# Patient Record
Sex: Male | Born: 1950 | Race: White | Hispanic: No | Marital: Single | Smoking: Current every day smoker
Health system: Southern US, Community
[De-identification: ages and names within clinical notes are randomized; demographics above are authoritative.]

## PROBLEM LIST (undated history)

## (undated) DIAGNOSIS — I10 Essential (primary) hypertension: Secondary | ICD-10-CM

## (undated) DIAGNOSIS — E119 Type 2 diabetes mellitus without complications: Secondary | ICD-10-CM

---

## 2014-07-24 ENCOUNTER — Emergency Department (HOSPITAL_COMMUNITY)
Admission: EM | Admit: 2014-07-24 | Discharge: 2014-07-24 | Disposition: A | Discharge status: Home / Self Care | Payer: No Typology Code available for payment source | Attending: Emergency Medicine | Admitting: Emergency Medicine

## 2014-07-24 ENCOUNTER — Encounter (HOSPITAL_COMMUNITY): Payer: Self-pay | Admitting: Emergency Medicine

## 2014-07-24 ENCOUNTER — Emergency Department (HOSPITAL_COMMUNITY): Payer: No Typology Code available for payment source

## 2014-07-24 DIAGNOSIS — I10 Essential (primary) hypertension: Secondary | ICD-10-CM | POA: Diagnosis not present

## 2014-07-24 DIAGNOSIS — R1012 Left upper quadrant pain: Secondary | ICD-10-CM | POA: Diagnosis not present

## 2014-07-24 DIAGNOSIS — E119 Type 2 diabetes mellitus without complications: Secondary | ICD-10-CM | POA: Diagnosis not present

## 2014-07-24 DIAGNOSIS — R1013 Epigastric pain: Secondary | ICD-10-CM | POA: Diagnosis not present

## 2014-07-24 DIAGNOSIS — R1011 Right upper quadrant pain: Secondary | ICD-10-CM | POA: Diagnosis not present

## 2014-07-24 DIAGNOSIS — M549 Dorsalgia, unspecified: Secondary | ICD-10-CM | POA: Insufficient documentation

## 2014-07-24 DIAGNOSIS — F17200 Nicotine dependence, unspecified, uncomplicated: Secondary | ICD-10-CM | POA: Diagnosis not present

## 2014-07-24 DIAGNOSIS — Z79899 Other long term (current) drug therapy: Secondary | ICD-10-CM | POA: Diagnosis not present

## 2014-07-24 HISTORY — DX: Type 2 diabetes mellitus without complications: E11.9

## 2014-07-24 HISTORY — DX: Essential (primary) hypertension: I10

## 2014-07-24 LAB — CBC WITH DIFFERENTIAL/PLATELET
BASOS ABS: 0 10*3/uL (ref 0.0–0.1)
Basophils Relative: 0 % (ref 0–1)
EOS PCT: 1 % (ref 0–5)
Eosinophils Absolute: 0.2 10*3/uL (ref 0.0–0.7)
HEMATOCRIT: 41.9 % (ref 39.0–52.0)
Hemoglobin: 14.2 g/dL (ref 13.0–17.0)
LYMPHS PCT: 20 % (ref 12–46)
Lymphs Abs: 2.2 10*3/uL (ref 0.7–4.0)
MCH: 30.1 pg (ref 26.0–34.0)
MCHC: 33.9 g/dL (ref 30.0–36.0)
MCV: 88.8 fL (ref 78.0–100.0)
MONO ABS: 0.9 10*3/uL (ref 0.1–1.0)
MONOS PCT: 9 % (ref 3–12)
Neutro Abs: 7.5 10*3/uL (ref 1.7–7.7)
Neutrophils Relative %: 70 % (ref 43–77)
Platelets: 308 10*3/uL (ref 150–400)
RBC: 4.72 MIL/uL (ref 4.22–5.81)
RDW: 12.7 % (ref 11.5–15.5)
WBC: 10.7 10*3/uL — AB (ref 4.0–10.5)

## 2014-07-24 LAB — COMPREHENSIVE METABOLIC PANEL
ALT: 12 U/L (ref 0–53)
ANION GAP: 12 (ref 5–15)
AST: 14 U/L (ref 0–37)
Albumin: 3.5 g/dL (ref 3.5–5.2)
Alkaline Phosphatase: 80 U/L (ref 39–117)
BUN: 9 mg/dL (ref 6–23)
CALCIUM: 9.3 mg/dL (ref 8.4–10.5)
CO2: 26 meq/L (ref 19–32)
CREATININE: 0.66 mg/dL (ref 0.50–1.35)
Chloride: 103 mEq/L (ref 96–112)
GFR calc Af Amer: >90 mL/min (ref >90)
GFR calc non Af Amer: >90 mL/min (ref >90)
Glucose, Bld: 117 mg/dL — ABNORMAL HIGH (ref 70–99)
Potassium: 3.5 mEq/L — ABNORMAL LOW (ref 3.7–5.3)
Sodium: 141 mEq/L (ref 137–147)
Total Bilirubin: 0.5 mg/dL (ref 0.3–1.2)
Total Protein: 7.4 g/dL (ref 6.0–8.3)

## 2014-07-24 LAB — LIPASE, BLOOD: Lipase: 17 U/L (ref 11–59)

## 2014-07-24 LAB — TROPONIN I

## 2014-07-24 MED ORDER — FAMOTIDINE IN NACL 20-0.9 MG/50ML-% IV SOLN
20.0000 mg | Freq: Once | INTRAVENOUS | Status: AC
  Administered 2014-07-24: 20 mg via INTRAVENOUS
  Filled 2014-07-24: qty 50

## 2014-07-24 MED ORDER — SUCRALFATE 1 GM/10ML PO SUSP: 1.0000 g | Freq: Three times a day (TID) | ORAL | Status: AC

## 2014-07-24 MED ORDER — FAMOTIDINE 20 MG PO TABS: 20.0000 mg | ORAL_TABLET | Freq: Two times a day (BID) | ORAL | Status: AC

## 2014-07-24 MED ORDER — ONDANSETRON HCL 4 MG/2ML IJ SOLN
4.0000 mg | Freq: Once | INTRAMUSCULAR | Status: AC
  Administered 2014-07-24: 4 mg via INTRAVENOUS
  Filled 2014-07-24: qty 2

## 2014-07-24 MED ORDER — SODIUM CHLORIDE 0.9 % IV SOLN
1000.0000 mL | Freq: Once | INTRAVENOUS | Status: AC
  Administered 2014-07-24: 1000 mL via INTRAVENOUS

## 2014-07-24 MED ORDER — HYDROMORPHONE HCL 1 MG/ML IJ SOLN
1.0000 mg | Freq: Once | INTRAMUSCULAR | Status: AC
  Administered 2014-07-24: 1 mg via INTRAVENOUS
  Filled 2014-07-24: qty 1

## 2014-07-24 NOTE — ED Notes (Signed)
Pt stuck three times for IV access. Pt refuses at this time for him to be stuck anymore at this time.  [facility] EDP aware, pt will allow phlebotomy for blood draw.

## 2014-07-24 NOTE — ED Notes (Addendum)
Pt states that he has been having 4 months of gen abd pain.  States he takes 30 ibuprofen tablets per day.  Pt states that his pain radiates to the back.  States he cannot take a deep breath d/t pain.  Has been having intermittent diarrhea.  Nauseated without vomiting.  Productive cough x 5 days.

## 2014-07-24 NOTE — Discharge Instructions (Signed)
Do not take ibuprofen, Aleve, Advil (NSAIDs). Schedule follow up with your doctor when you get home to be referred to a gastroenterologist. If you have any vomiting blood, rectal bleeding, go to the nearest ER.  Abdominal Pain Many things can cause abdominal pain. Usually, abdominal pain is not caused by a disease and will improve without treatment. It can often be observed and treated at home. Your health care provider will do a physical exam and possibly order blood tests and X-rays to help determine the seriousness of your pain. However, in many cases, more time must pass before a clear cause of the pain can be found. Before that point, your health care provider may not know if you need more testing or further treatment. HOME CARE INSTRUCTIONS  Monitor your abdominal pain for any changes. The following actions may help to alleviate any discomfort you are experiencing:  Only take over-the-counter or prescription medicines as directed by your health care provider.  Do not take laxatives unless directed to do so by your health care provider.  Try a clear liquid diet (broth, tea, or water) as directed by your health care provider. Slowly move to a bland diet as tolerated. SEEK MEDICAL CARE IF:  You have unexplained abdominal pain.  You have abdominal pain associated with nausea or diarrhea.  You have pain when you urinate or have a bowel movement.  You experience abdominal pain that wakes you in the night.  You have abdominal pain that is worsened or improved by eating food.  You have abdominal pain that is worsened with eating fatty foods.  You have a fever. SEEK IMMEDIATE MEDICAL CARE IF:   Your pain does not go away within 2 hours.  You keep throwing up (vomiting).  Your pain is felt only in portions of the abdomen, such as the right side or the left lower portion of the abdomen.  You pass bloody or black tarry stools. MAKE SURE YOU:  Understand these instructions.   Will  watch your condition.   Will get help right away if you are not doing well or get worse.  Document Released: 08/04/2005 Document Revised: 10/30/2013 Document Reviewed: 07/04/2013 [facility] Patient Information 2015 [place], [place]. This information is not intended to replace advice given to you by your health care provider. Make sure you discuss any questions you have with your health care provider.

## 2014-07-24 NOTE — ED Provider Notes (Signed)
CSN: [id number]     Arrival date & time 07/24/14  1112 History   First MD Initiated Contact with Patient 07/24/14 1143     Chief Complaint  Patient presents with  . Abdominal Pain  . Back Pain     (Consider location/radiation/quality/duration/timing/severity/associated sxs/prior Treatment) HPI Comments: Patient presents to the ER for evaluation of abdominal pain. Patient reports that he has been experiencing abdominal pain for 4 or 5 months. Patient reports the pain is present daily. Pain is sharp and stabbing across his upper abdomen. He has been taking large amounts of ibuprofen without improvement. He has not had any vomiting or diarrhea. No rectal bleeding. He does have a distant history of an ulcer. Patient is a long-distance [employer], called his doctor back in [place] and was told to go to the nearest ER because they were concerned he might have a bleeding ulcer.  Patient is a 63 y.o. male presenting with abdominal pain and back pain.  Abdominal Pain Back Pain Associated symptoms: abdominal pain     Past Medical History  Diagnosis Date  . Diabetes mellitus without complication   . Hypertension    History reviewed. No pertinent past surgical history. History reviewed. No pertinent family history. History  Substance Use Topics  . Smoking status: Current Every Day Smoker    Types: Cigarettes  . Smokeless tobacco: Not on file  . Alcohol Use: No    Review of Systems  Gastrointestinal: Positive for abdominal pain.  Musculoskeletal: Positive for back pain.  All other systems reviewed and are negative.     Allergies  Review of patient's allergies indicates no known allergies.  Home Medications   Prior to Admission medications   Medication Sig Start Date End Date Taking? Authorizing Provider  gabapentin (NEURONTIN) 100 MG capsule Take 100 mg by mouth 3 (three) times daily.   Yes Historical Provider, MD  lisinopril (PRINIVIL,ZESTRIL) 10 MG tablet Take 5 mg by mouth  daily.    Yes Historical Provider, MD  metFORMIN (GLUCOPHAGE) 500 MG tablet Take 500 mg by mouth 2 (two) times daily with a meal.   Yes Historical Provider, MD  simvastatin (ZOCOR) 20 MG tablet Take 20 mg by mouth daily.   Yes Historical Provider, MD   BP 123/66  Pulse 91  Temp(Src) 98.1 F (36.7 C) (Oral)  Resp 18  SpO2 94% Physical Exam  Constitutional: He is oriented to person, place, and time. He appears well-developed and well-nourished. No distress.  HENT:  Head: Normocephalic and atraumatic.  Right Ear: Hearing normal.  Left Ear: Hearing normal.  Nose: Nose normal.  Mouth/Throat: Oropharynx is clear and moist and mucous membranes are normal.  Eyes: Conjunctivae and EOM are normal. Pupils are equal, round, and reactive to light.  Neck: Normal range of motion. Neck supple.  Cardiovascular: Regular rhythm, S1 normal and S2 normal.  Exam reveals no gallop and no friction rub.   No murmur heard. Pulmonary/Chest: Effort normal and breath sounds normal. No respiratory distress. He exhibits no tenderness.  Abdominal: Soft. Normal appearance and bowel sounds are normal. There is no hepatosplenomegaly. There is tenderness (diffuse upper with significant epigastric tenderness) in the right upper quadrant, epigastric area and left upper quadrant. There is no rebound, no guarding, no tenderness at McBurney's point and negative Murphy's sign. No hernia.  Musculoskeletal: Normal range of motion.  Neurological: He is alert and oriented to person, place, and time. He has normal strength. No cranial nerve deficit or sensory deficit. Coordination normal. GCS  eye subscore is 4. GCS verbal subscore is 5. GCS motor subscore is 6.  Skin: Skin is warm, dry and intact. No rash noted. No cyanosis.  Psychiatric: He has a normal mood and affect. His speech is normal and behavior is normal. Thought content normal.    ED Course  Procedures (including critical care time) Labs Review Labs Reviewed  CBC WITH  DIFFERENTIAL - Abnormal; Notable for the following:    WBC 10.7 (*)    All other components within normal limits  COMPREHENSIVE METABOLIC PANEL - Abnormal; Notable for the following:    Potassium 3.5 (*)    Glucose, Bld 117 (*)    All other components within normal limits  LIPASE, BLOOD  TROPONIN I  URINALYSIS, ROUTINE W REFLEX MICROSCOPIC    Imaging Review US Abdomen Complete  07/24/2014   CLINICAL DATA:  Abdominal pain, nausea  EXAM: ULTRASOUND ABDOMEN COMPLETE  COMPARISON:  None.  FINDINGS: Gallbladder:  No gallstones or wall thickening visualized. No sonographic Murphy sign noted.  Common bile duct:  Diameter: Normal at 2.4 mm  Liver:  Therapy 2 rounded hyperechoic lesions within the liver measuring 2 cm each. These demonstrate through transmission which is typical of benign hemangiomas. No biliary duct dilatation.  IVC:  No abnormality visualized.  Pancreas:  Visualized portion unremarkable.  Spleen:  Size and appearance within normal limits.  Right Kidney:  Length: 13.1. There is a 2 cm multilobulated cystic lesion within the right renal cortex which has through transmission and no internal echoes. No nodularity or vascularity.  Left Kidney:  Length: 13.1 cm. Echogenicity within normal limits. No mass or hydronephrosis visualized.  Abdominal aorta:  No aneurysm visualized.  Other findings:  No free fluid  IMPRESSION: 1. Normal gallbladder. 2. Two hyperechoic lesions within the liver parenchyma have imaging features most consistent with benign hemangiomas. 3. Benign-appearing cyst in the right kidney.   Electronically Signed   By: Genevive Bi M.D.   On: 07/24/2014 13:30   Dg Abd Acute W/chest  07/24/2014   CLINICAL DATA:  abd pain  EXAM: ACUTE ABDOMEN SERIES (ABDOMEN 2 VIEW & CHEST 1 VIEW)  COMPARISON:  Ultrasound 07/14/2014  FINDINGS: Normal cardiac silhouette. There is bibasilar atelectasis with tenting at the hemidiaphragm. No pulmonary edema. No pleural fluid.  No dilated large or  small bowel. Pathologic calcifications. Liver appears mildly enlarged with tip extending to the iliac fossa.  IMPRESSION: 1. Left basilar atelectasis. 2. No pulmonary edema or pneumothorax 3. No bowel obstruction or free air. 4. Liver may be enlarged. No focal abnormality on comparison ultrasound.   Electronically Signed   By: Genevive Bi M.D.   On: 07/24/2014 14:00     EKG Interpretation None      MDM   Final diagnoses:  None   abdominal pain    Patient presents to the ER for evaluation abdominal pain. Patient has been experiencing pain for 4-5 months. Patient reports the pain is present daily. He is describing a sharp pain across his upper abdomen. He is uncomfortable when he moves, and reports that his clothes touching area even worsen the pain. Examination revealed tenderness across the upper abdomen, worst in the epigastric region. No peritonitis.  Patient's blood work is unremarkable. Patient is very tender, cardiac etiology is felt unlikely. Cardiac workup was negative. Lipase negative. Conference of including LFTs are normal. Ultrasound is performed to further evaluate for possible gallbladder disease. It was normal. Plain film x-ray was performed to rule out free air, obstruction,  et Karie Soda. It was normal.  At this point I did discuss that he would need further workup. Gastric ulcer, cancer, etc. has not been ruled out and he understands this. He is currently passing through the area, will follow up with his primary doctor when he gets home in [place] to have referral to gastroenterology. He'll be given pain medication and then started on antacids. 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    Gilda Crease, MD 07/24/14 [phone]

## 2015-01-14 IMAGING — CR DG ABDOMEN ACUTE W/ 1V CHEST
3 series · 3 of 3 positions shown · non-contrast
Comparison: Ultrasound 07/14/2014

CLINICAL DATA: abd pain

EXAM:
ACUTE ABDOMEN SERIES (ABDOMEN 2 VIEW & CHEST 1 VIEW)

[w chest pa]
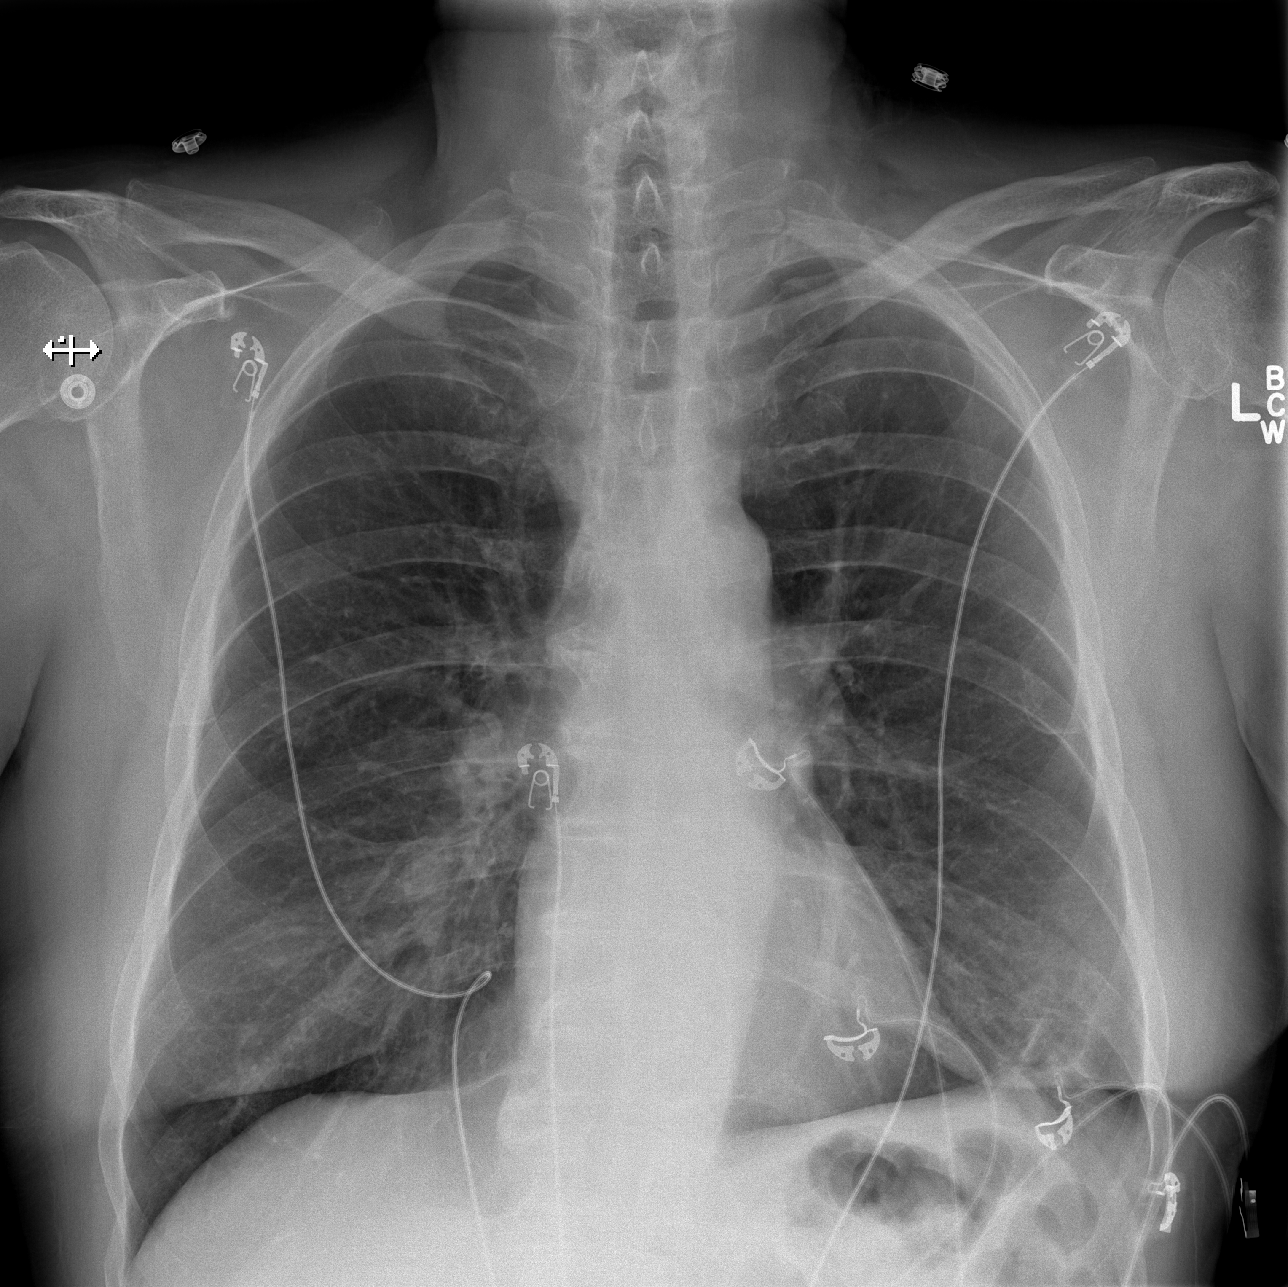

[w abdomen upright]
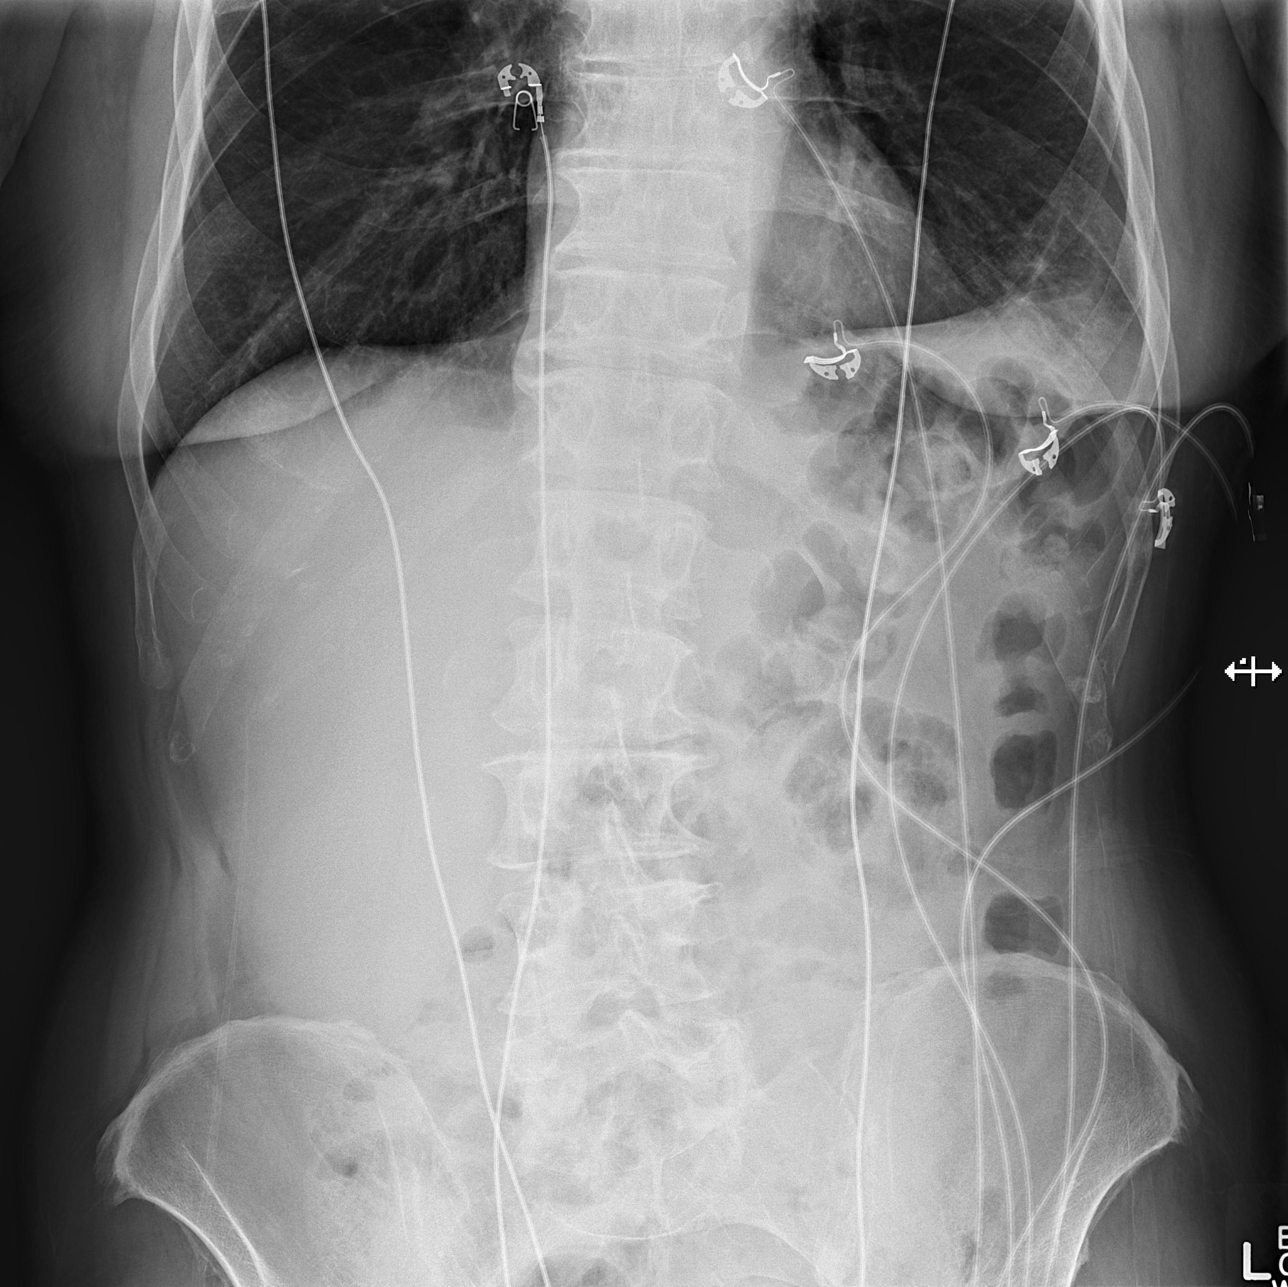

[t abdomen supine]
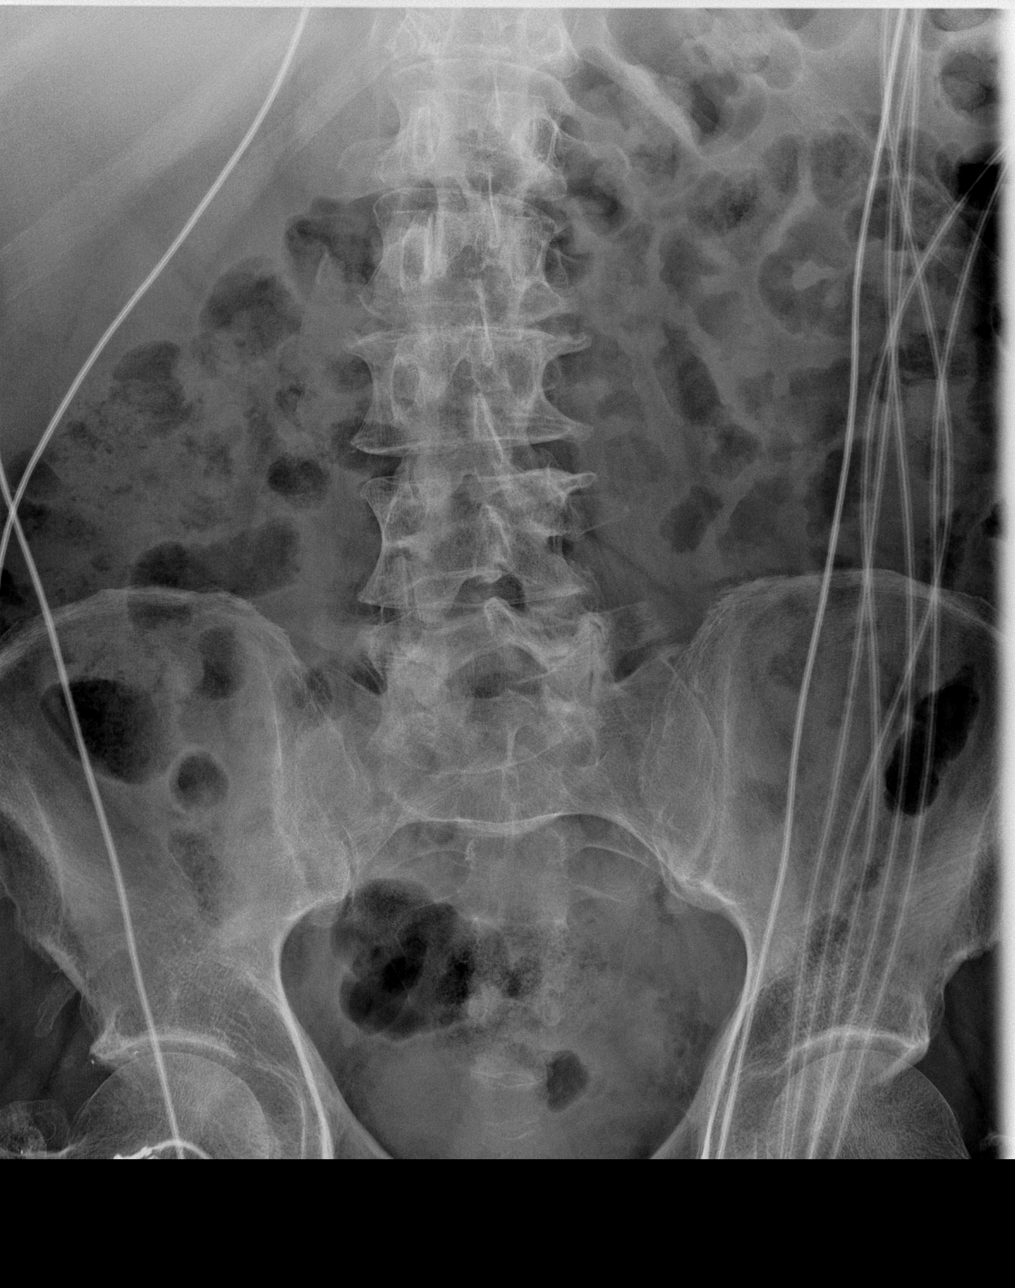

[3 of 3 positions shown; findings below may reference images not displayed]

FINDINGS: Normal cardiac silhouette. There is bibasilar atelectasis with
tenting at the hemidiaphragm. No pulmonary edema. No pleural fluid.

No dilated large or small bowel. Pathologic calcifications. Liver
appears mildly enlarged with tip extending to the iliac fossa.
IMPRESSION: 1. Left basilar atelectasis.
2. No pulmonary edema or pneumothorax
3. No bowel obstruction or free air.
4. Liver may be enlarged. No focal abnormality on comparison
ultrasound.
